# Patient Record
Sex: Female | Born: 1940 | Race: White | Hispanic: No | Smoking: Never smoker
Health system: Southern US, Community
[De-identification: ages and names within clinical notes are randomized; demographics above are authoritative.]

## PROBLEM LIST (undated history)

## (undated) DIAGNOSIS — F319 Bipolar disorder, unspecified: Secondary | ICD-10-CM

## (undated) HISTORY — PX: ABDOMINAL HYSTERECTOMY: SHX81

## (undated) HISTORY — PX: APPENDECTOMY: SHX54

---

## 2002-09-17 ENCOUNTER — Encounter: Admission: RE | Admit: 2002-09-17 | Discharge: 2002-09-17 | Payer: Self-pay | Admitting: Internal Medicine

## 2004-02-07 ENCOUNTER — Ambulatory Visit: Payer: Self-pay | Admitting: Internal Medicine

## 2004-02-07 DIAGNOSIS — Q762 Congenital spondylolisthesis: Secondary | ICD-10-CM | POA: Insufficient documentation

## 2004-02-14 ENCOUNTER — Ambulatory Visit: Payer: Self-pay | Admitting: Internal Medicine

## 2004-03-20 ENCOUNTER — Ambulatory Visit: Payer: Self-pay | Admitting: Internal Medicine

## 2004-04-19 ENCOUNTER — Ambulatory Visit: Payer: Self-pay

## 2004-04-20 ENCOUNTER — Ambulatory Visit (HOSPITAL_COMMUNITY): Admission: RE | Admit: 2004-04-20 | Discharge: 2004-04-20 | Payer: Self-pay | Admitting: Internal Medicine

## 2004-04-27 ENCOUNTER — Ambulatory Visit: Payer: Self-pay

## 2004-06-29 ENCOUNTER — Encounter: Admission: RE | Admit: 2004-06-29 | Discharge: 2004-06-29 | Payer: Self-pay | Admitting: Internal Medicine

## 2005-03-14 ENCOUNTER — Ambulatory Visit: Payer: Self-pay | Admitting: Internal Medicine

## 2005-03-21 ENCOUNTER — Ambulatory Visit: Payer: Self-pay | Admitting: Internal Medicine

## 2005-04-17 ENCOUNTER — Ambulatory Visit: Payer: Self-pay | Admitting: Internal Medicine

## 2005-05-02 ENCOUNTER — Encounter: Admission: RE | Admit: 2005-05-02 | Discharge: 2005-05-02 | Payer: Self-pay | Admitting: Internal Medicine

## 2005-06-06 ENCOUNTER — Ambulatory Visit: Payer: Self-pay | Admitting: Internal Medicine

## 2005-07-14 ENCOUNTER — Emergency Department (HOSPITAL_COMMUNITY): Admission: EM | Admit: 2005-07-14 | Discharge: 2005-07-14 | Payer: Self-pay | Admitting: Emergency Medicine

## 2005-07-19 ENCOUNTER — Ambulatory Visit: Payer: Self-pay | Admitting: Infectious Diseases

## 2005-07-19 ENCOUNTER — Ambulatory Visit: Payer: Self-pay | Admitting: Internal Medicine

## 2005-07-19 ENCOUNTER — Inpatient Hospital Stay (HOSPITAL_COMMUNITY): Admission: AD | Admit: 2005-07-19 | Discharge: 2005-07-22 | Payer: Self-pay | Admitting: Internal Medicine

## 2005-07-24 ENCOUNTER — Ambulatory Visit: Payer: Self-pay | Admitting: Internal Medicine

## 2005-09-23 ENCOUNTER — Ambulatory Visit: Payer: Self-pay | Admitting: Internal Medicine

## 2005-11-20 ENCOUNTER — Ambulatory Visit: Payer: Self-pay | Admitting: Internal Medicine

## 2005-12-11 ENCOUNTER — Ambulatory Visit (HOSPITAL_COMMUNITY): Admission: RE | Admit: 2005-12-11 | Discharge: 2005-12-11 | Payer: Self-pay | Admitting: Gastroenterology

## 2005-12-11 ENCOUNTER — Encounter (INDEPENDENT_AMBULATORY_CARE_PROVIDER_SITE_OTHER): Payer: Self-pay | Admitting: Specialist

## 2006-07-08 ENCOUNTER — Inpatient Hospital Stay (HOSPITAL_COMMUNITY): Admission: AD | Admit: 2006-07-08 | Discharge: 2006-07-18 | Payer: Self-pay | Admitting: *Deleted

## 2006-07-08 ENCOUNTER — Encounter: Payer: Self-pay | Admitting: Emergency Medicine

## 2006-07-08 ENCOUNTER — Ambulatory Visit: Payer: Self-pay | Admitting: *Deleted

## 2006-08-12 ENCOUNTER — Ambulatory Visit: Payer: Self-pay | Admitting: Internal Medicine

## 2006-08-12 ENCOUNTER — Encounter (INDEPENDENT_AMBULATORY_CARE_PROVIDER_SITE_OTHER): Payer: Self-pay | Admitting: Internal Medicine

## 2006-08-12 DIAGNOSIS — M949 Disorder of cartilage, unspecified: Secondary | ICD-10-CM

## 2006-08-12 DIAGNOSIS — R252 Cramp and spasm: Secondary | ICD-10-CM

## 2006-08-12 DIAGNOSIS — F411 Generalized anxiety disorder: Secondary | ICD-10-CM | POA: Insufficient documentation

## 2006-08-12 DIAGNOSIS — F329 Major depressive disorder, single episode, unspecified: Secondary | ICD-10-CM

## 2006-08-12 DIAGNOSIS — E785 Hyperlipidemia, unspecified: Secondary | ICD-10-CM | POA: Insufficient documentation

## 2006-08-12 DIAGNOSIS — K219 Gastro-esophageal reflux disease without esophagitis: Secondary | ICD-10-CM

## 2006-08-12 DIAGNOSIS — M899 Disorder of bone, unspecified: Secondary | ICD-10-CM | POA: Insufficient documentation

## 2006-08-12 LAB — CONVERTED CEMR LAB
ALT: 12 units/L (ref 0–35)
AST: 22 units/L (ref 0–37)
Albumin: 3.5 g/dL (ref 3.5–5.2)
Calcium: 9.1 mg/dL (ref 8.4–10.5)
Chloride: 100 meq/L (ref 96–112)
LDL Cholesterol: 173 mg/dL — ABNORMAL HIGH (ref 0–99)
Total CHOL/HDL Ratio: 3.5
Triglycerides: 116 mg/dL (ref <150)
VLDL: 23 mg/dL (ref 0–40)

## 2006-08-14 ENCOUNTER — Encounter (INDEPENDENT_AMBULATORY_CARE_PROVIDER_SITE_OTHER): Payer: Self-pay | Admitting: Internal Medicine

## 2006-09-24 ENCOUNTER — Emergency Department (HOSPITAL_COMMUNITY): Admission: EM | Admit: 2006-09-24 | Discharge: 2006-09-24 | Payer: Self-pay | Admitting: Emergency Medicine

## 2006-09-26 ENCOUNTER — Inpatient Hospital Stay (HOSPITAL_COMMUNITY): Admission: EM | Admit: 2006-09-26 | Discharge: 2006-10-06 | Payer: Self-pay | Admitting: Emergency Medicine

## 2006-09-29 ENCOUNTER — Ambulatory Visit: Payer: Self-pay | Admitting: Vascular Surgery

## 2006-09-29 ENCOUNTER — Encounter (INDEPENDENT_AMBULATORY_CARE_PROVIDER_SITE_OTHER): Payer: Self-pay | Admitting: *Deleted

## 2006-09-30 ENCOUNTER — Ambulatory Visit: Payer: Self-pay | Admitting: Internal Medicine

## 2006-10-13 ENCOUNTER — Telehealth (INDEPENDENT_AMBULATORY_CARE_PROVIDER_SITE_OTHER): Payer: Self-pay | Admitting: Internal Medicine

## 2007-02-05 ENCOUNTER — Ambulatory Visit: Payer: Self-pay | Admitting: *Deleted

## 2007-02-05 DIAGNOSIS — G4700 Insomnia, unspecified: Secondary | ICD-10-CM

## 2007-03-10 IMAGING — CR DG CHEST 2V
2 series · 2 of 2 positions shown · non-contrast
Comparison: No prior exams for comparison.

CLINICAL DATA: 64-year-old with facial abscess.  Swelling and redness into arms.
 CHEST ? 2 VIEW:

[view not recorded (1 of 2)]
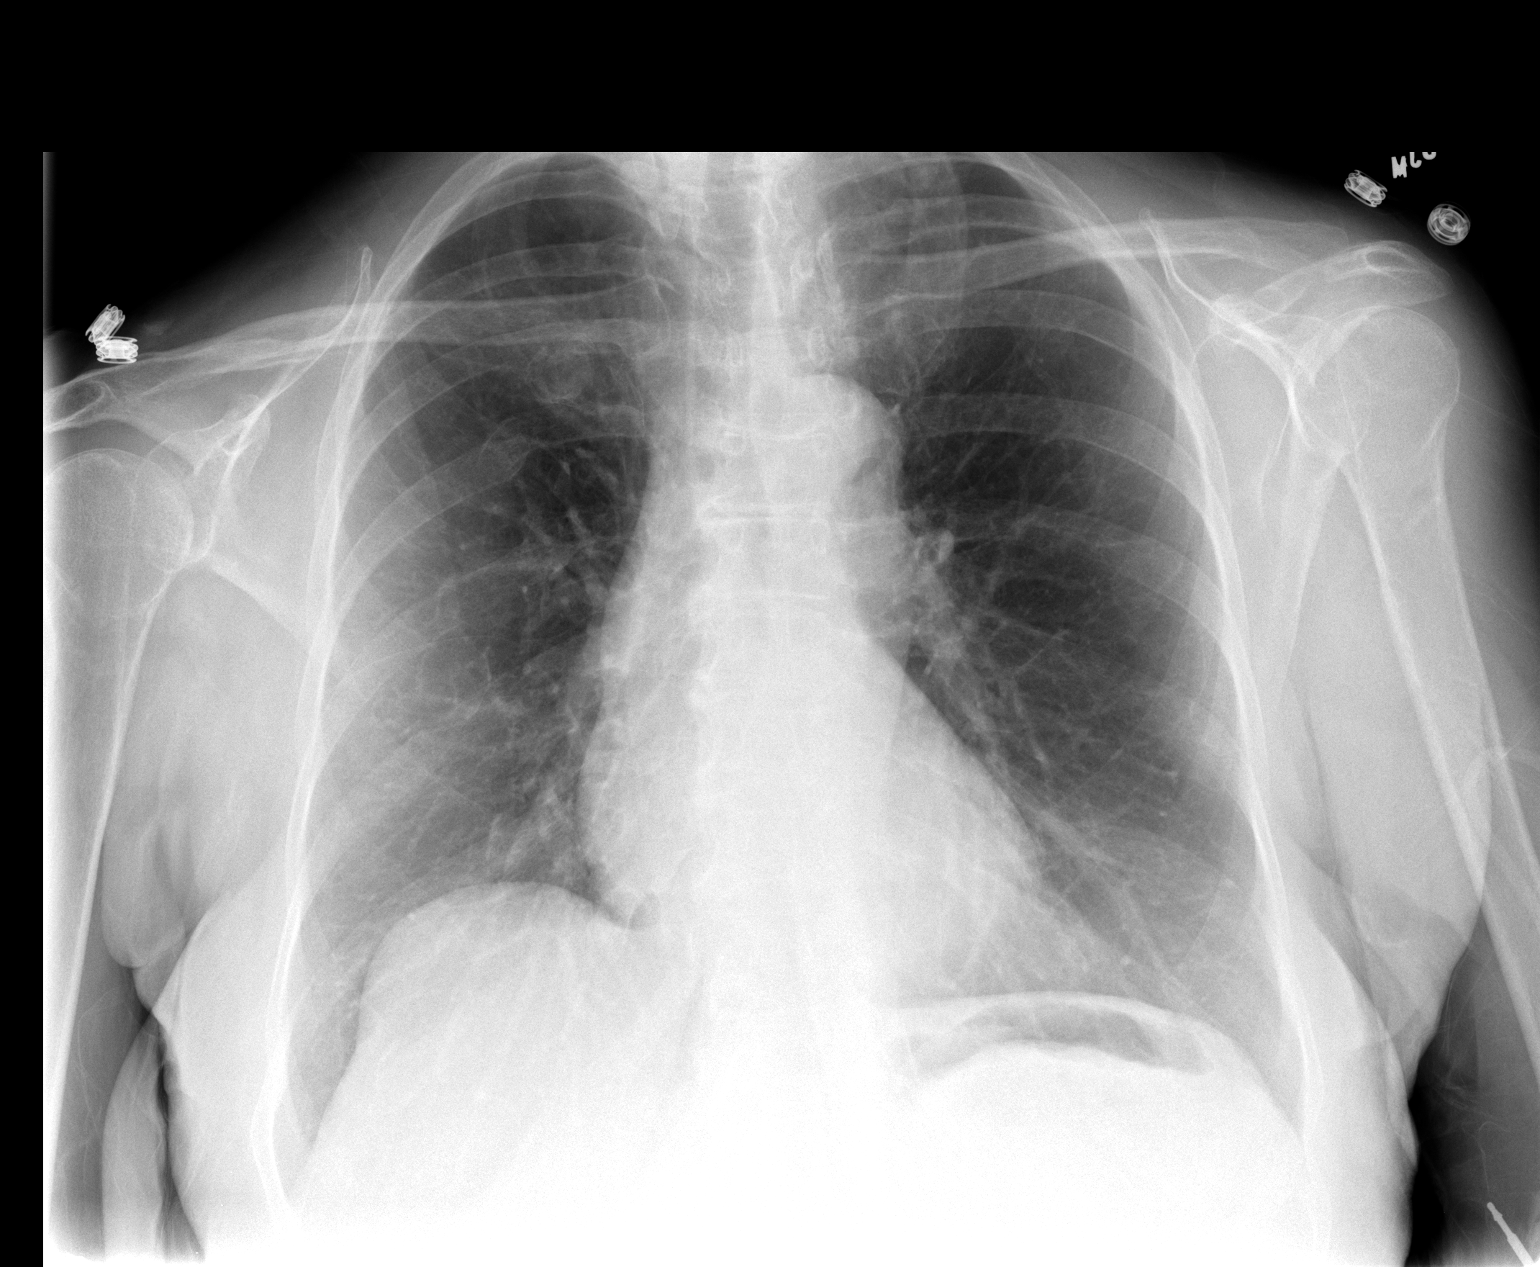

[view not recorded (2 of 2)]
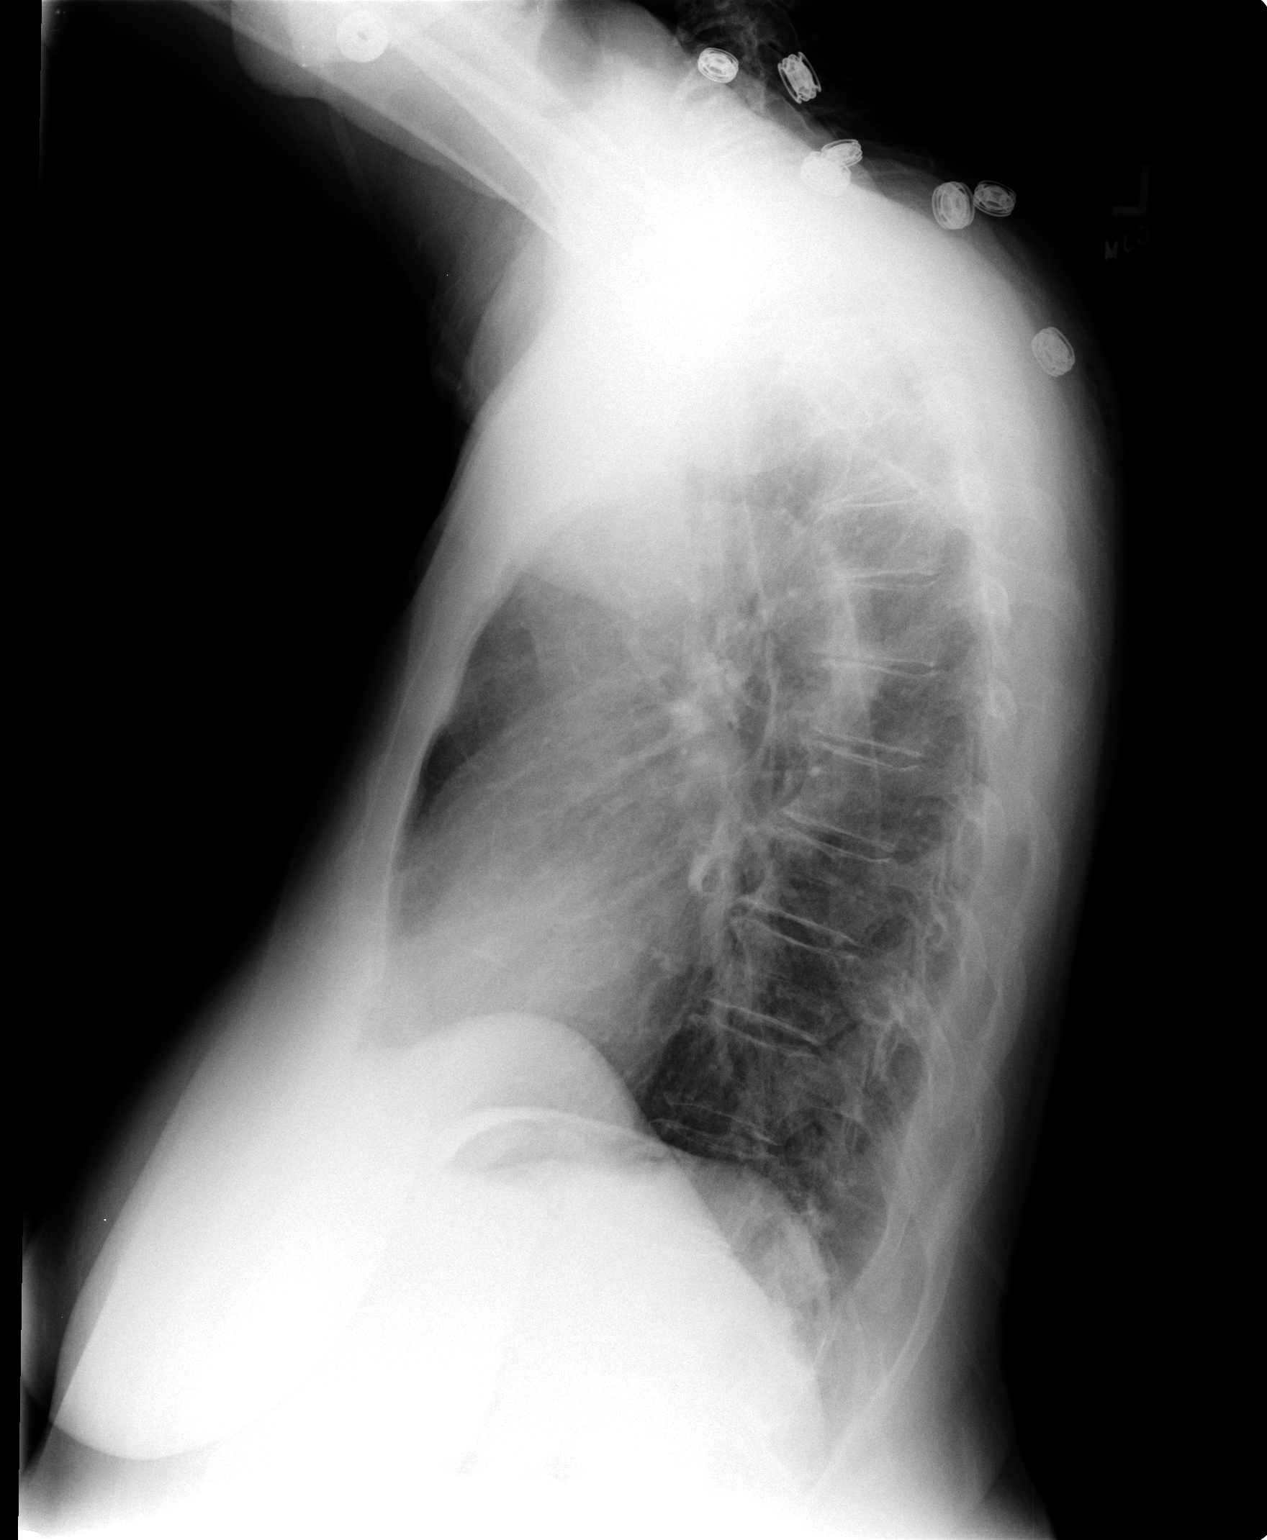

[2 of 2 positions shown; findings below may reference images not displayed]

FINDINGS: There is elevation of the right hemidiaphragm.  Heart size is normal.  No focal consolidations or pleural effusions.  Note is made of old right rib fractures.  No acute fractures or dislocation identified.  No evidence for bone erosion involving the upper ribs.
IMPRESSION: No evidence for acute cardiopulmonary disease.

## 2007-03-11 IMAGING — DX DG ORTHOPANTOGRAM /PANORAMIC
1 series · 1 of 1 positions shown · non-contrast
Comparison: none

CLINICAL DATA: 64-year-old with facial abscess. 
PANOREX VIEW OF THE MANDIBLE:

[view not recorded]
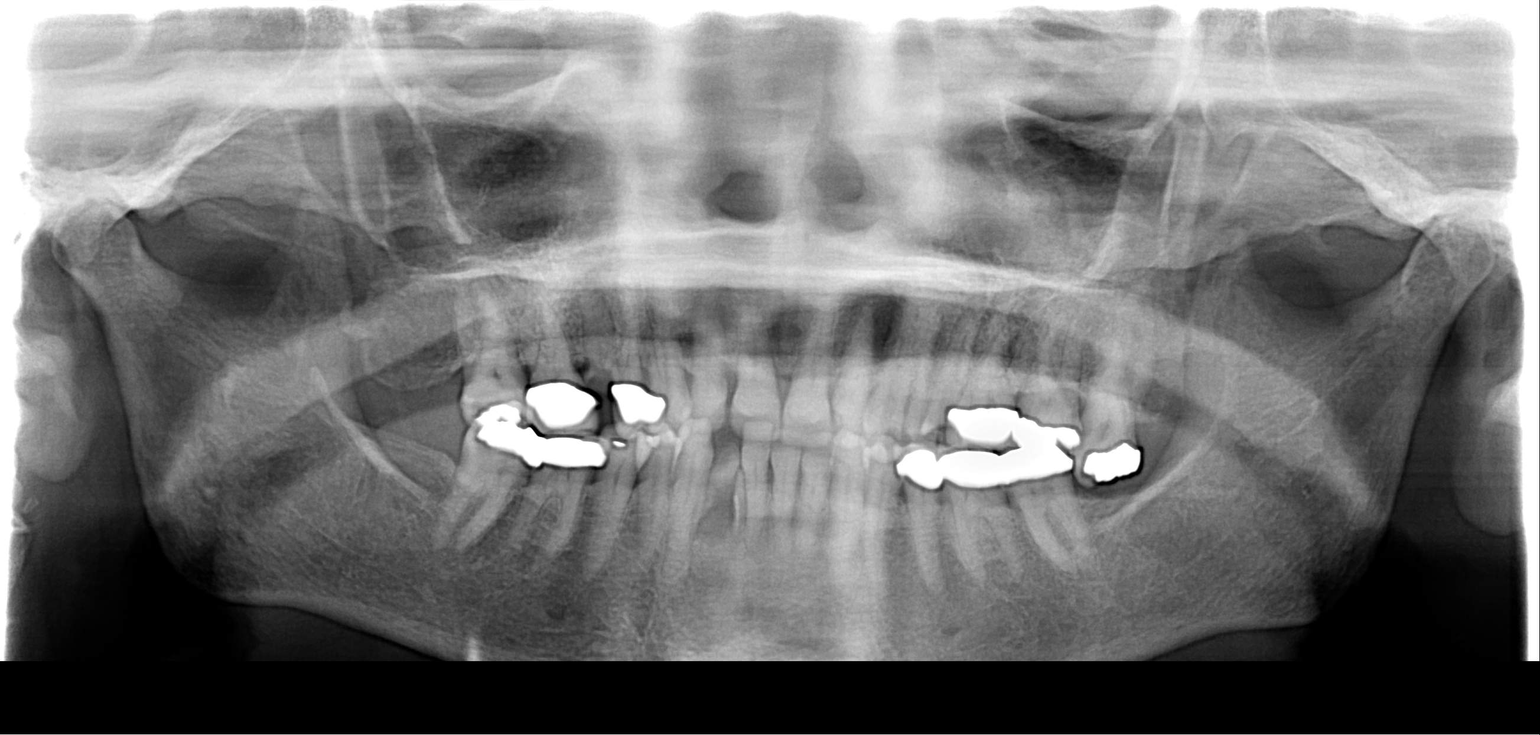

[1 of 1 positions shown; findings below may reference images not displayed]

FINDINGS: No evidence for periapical abscesses involving the mandibular teeth.  Lucency of the right lateral incisor may be dental caries.  There is some lucency around the left maxillary lateral incisor but this does not correspond to the CT findings and may be artifact.
IMPRESSION: Dental caries involving the right lateral mandibular incisor.  There is also apparent lucency around the left maxillary  lateral incisor but this may be artifact and does correlate with the CT findings.  I do not see any definite other significant findings involving the left maxillary teeth other than dental caries.

## 2007-03-31 ENCOUNTER — Ambulatory Visit: Payer: Self-pay | Admitting: Hospitalist

## 2007-03-31 ENCOUNTER — Encounter (INDEPENDENT_AMBULATORY_CARE_PROVIDER_SITE_OTHER): Payer: Self-pay | Admitting: *Deleted

## 2007-04-11 LAB — CONVERTED CEMR LAB
BUN: 20 mg/dL (ref 6–23)
CO2: 25 meq/L (ref 19–32)
Calcium: 9.4 mg/dL (ref 8.4–10.5)
Chloride: 104 meq/L (ref 96–112)
Creatinine, Ser: 1.05 mg/dL (ref 0.40–1.20)
Glucose, Bld: 112 mg/dL — ABNORMAL HIGH (ref 70–99)
Triglycerides: 107 mg/dL (ref <150)

## 2007-04-30 ENCOUNTER — Ambulatory Visit: Payer: Self-pay | Admitting: Internal Medicine

## 2007-04-30 ENCOUNTER — Encounter (INDEPENDENT_AMBULATORY_CARE_PROVIDER_SITE_OTHER): Payer: Self-pay | Admitting: Internal Medicine

## 2007-04-30 LAB — CONVERTED CEMR LAB
CO2: 27 meq/L (ref 19–32)
Calcium: 9.1 mg/dL (ref 8.4–10.5)
Creatinine, Ser: 1.05 mg/dL (ref 0.40–1.20)
Glucose, Bld: 85 mg/dL (ref 70–99)

## 2008-05-17 IMAGING — CR DG THORACIC SPINE 2V
3 series · 3 of 3 positions shown · non-contrast
Comparison: none

CLINICAL DATA: Patient fell, has mid to upper back pain.
 THORACIC SPINE ? 3 VIEW:

[t t-spine a.p.]
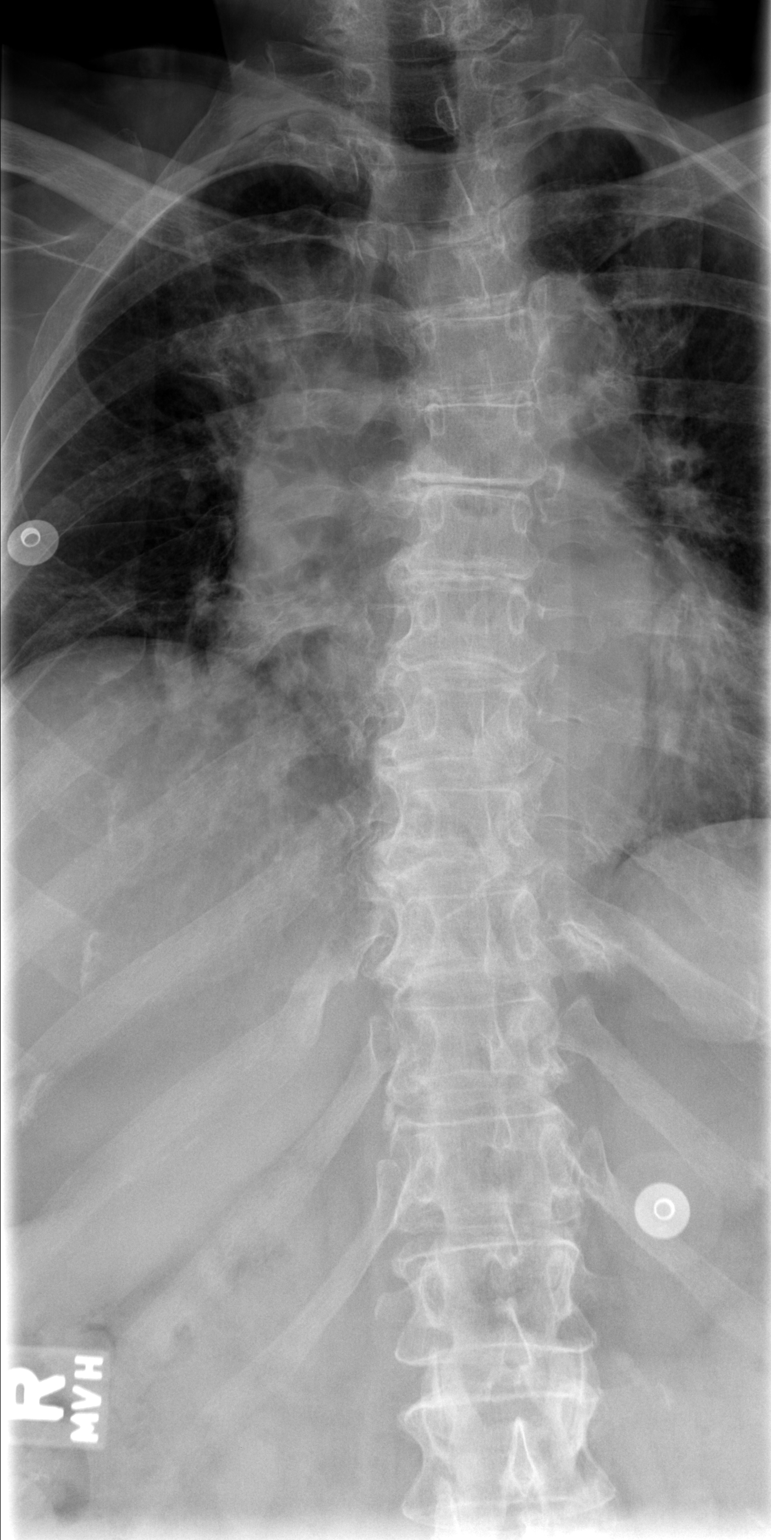

[t t-spine lat]
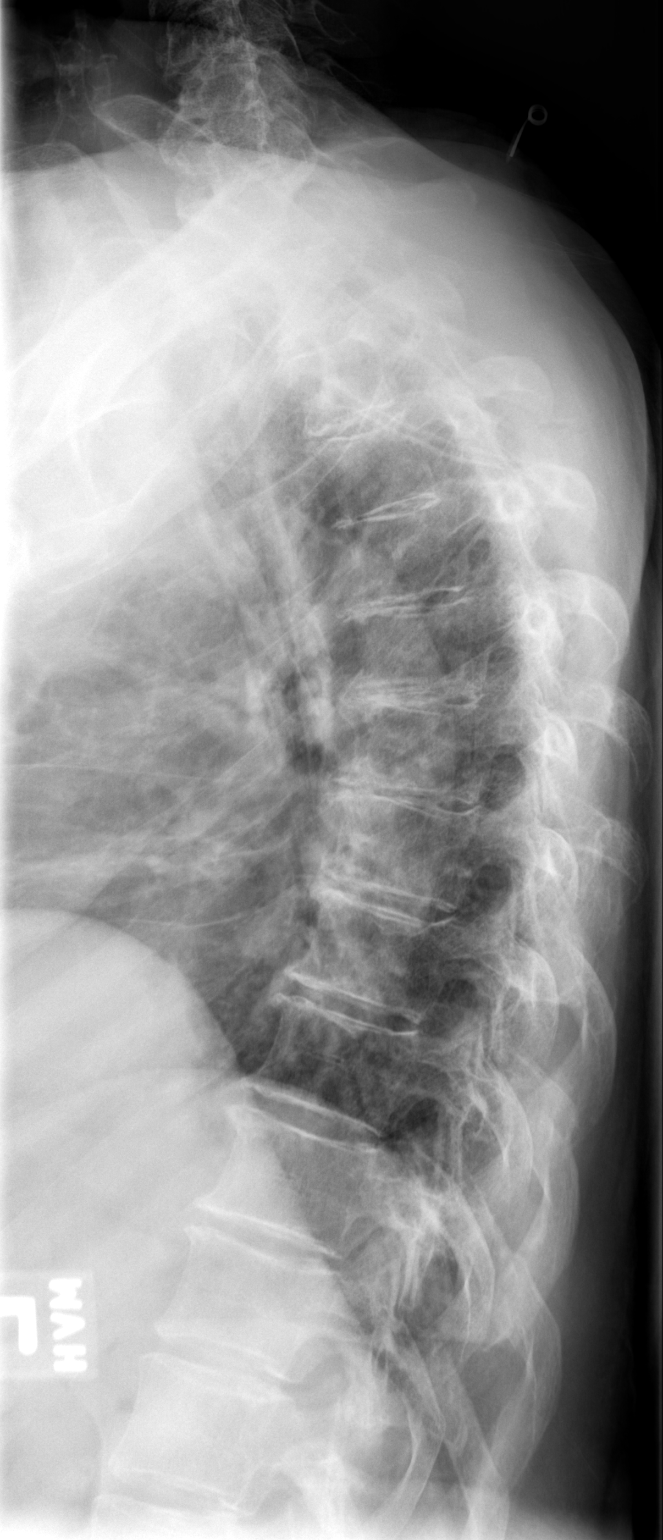

[t swimmers *]
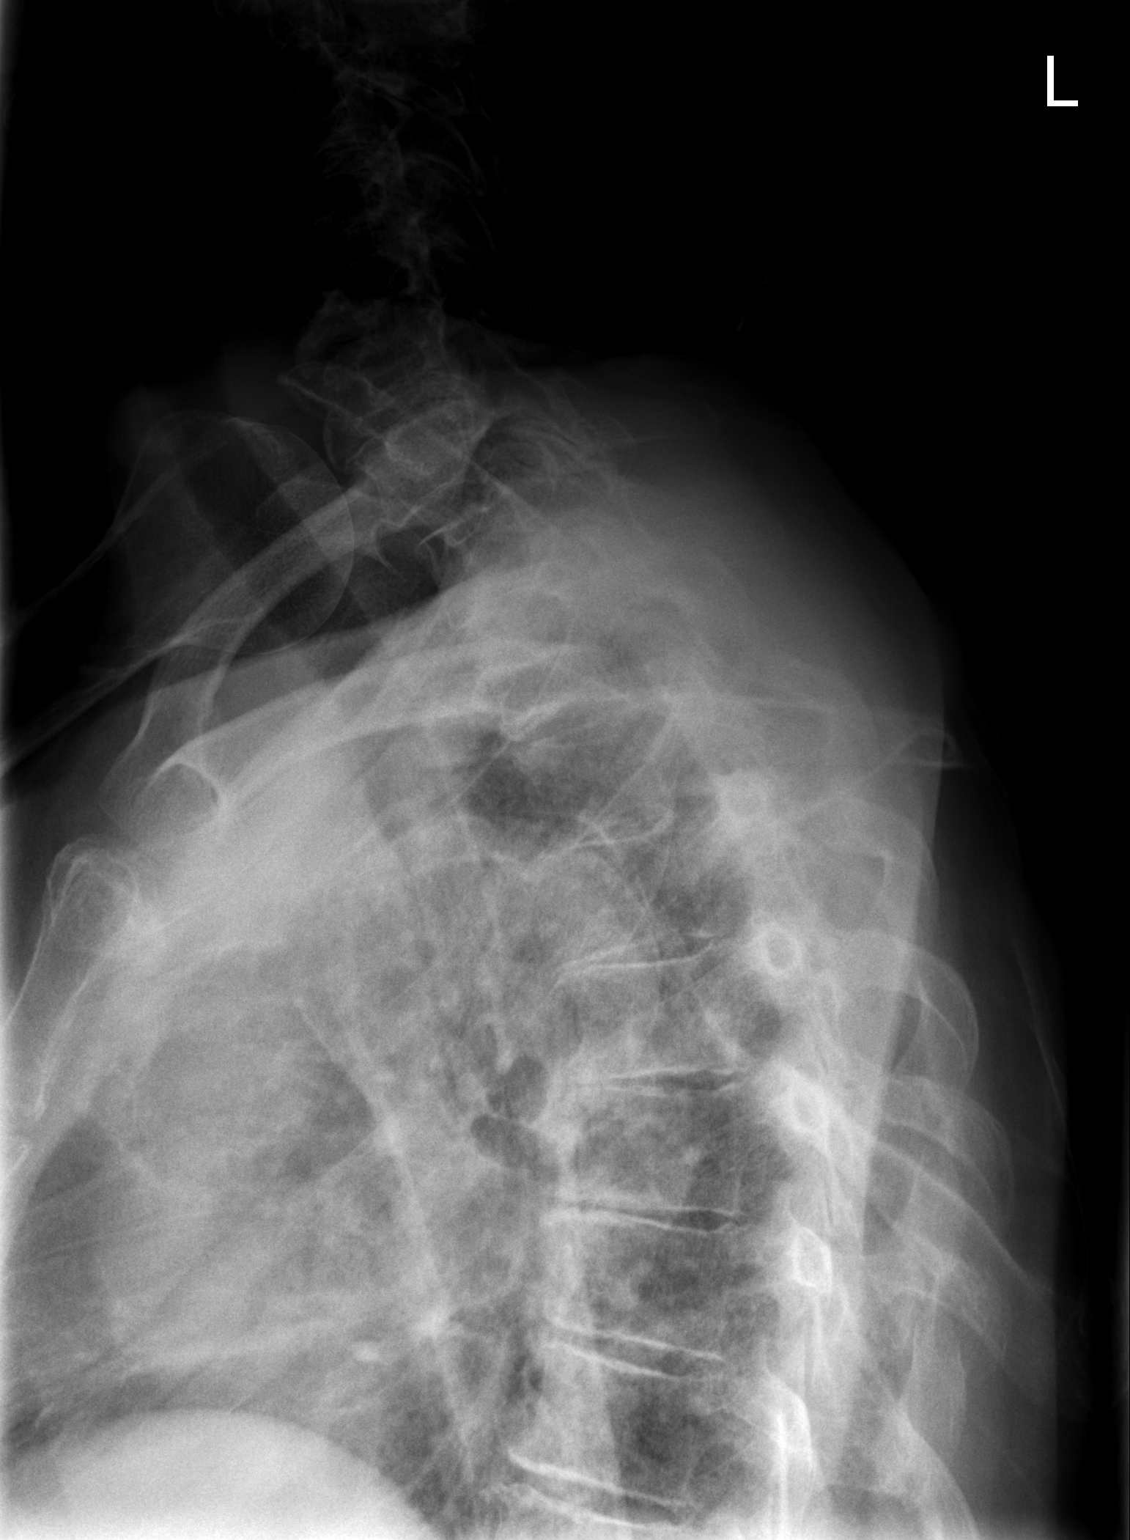

[3 of 3 positions shown; findings below may reference images not displayed]

FINDINGS: Mild mid to lower thoracic scoliosis convex to the right and mild upper thoracic scoliosis convex to the left.  No acute bony abnormality.  Degenerative disk disease changes are present at T5-6, T6-7, and T7-8.  Remote healed fracture posterior medial aspect of right 10th rib.
IMPRESSION: Mild scoliosis and degenerative spondylotic changes.  No acute bony abnormality.

## 2010-04-18 ENCOUNTER — Encounter
Admission: RE | Admit: 2010-04-18 | Discharge: 2010-04-18 | Payer: Self-pay | Source: Home / Self Care | Attending: Geriatric Medicine | Admitting: Geriatric Medicine

## 2010-04-29 ENCOUNTER — Encounter: Payer: Self-pay | Admitting: Internal Medicine

## 2010-08-21 NOTE — Consult Note (Signed)
Darlene Phillips, Darlene Phillips              ACCOUNT NO.:  [id number]   MEDICAL RECORD NO.:  [id number]          PATIENT TYPE:  INP   LOCATION:  1417                         FACILITY:  [facility]   PHYSICIAN:  Antonietta Breach, M.D.  DATE OF BIRTH:  [birth year]   DATE OF CONSULTATION:  10/06/2006  DATE OF DISCHARGE:                                 CONSULTATION   REASON FOR STAT STATUS:  The patient is pending discharge.   Darlene Phillips has experienced improved mood.  Her energy is improved. Her  sleep is still only 5-1/2 hours per night.  This leaves her with fatigue  in the morning. She has intact interests. Her concentration is still  slightly decreased.   REVIEW OF SYSTEMS:  No hallucinations, no delusions.  She is cooperative  with care and socially appropriate.   PSYCHOTROPIC MEDICATIONS:  Continue to be:  1. Celexa 20 mg daily.  2. Risperdal 2 mg nightly.  3. Tranxene 7.5 mg nightly p.r.n.  4. Ambien 10 mg nightly p.r.n.   PHYSICAL EXAMINATION:  VITAL SIGNS:  Temperature 98.3, pulse 90,  respirations 20, blood pressure 116/60, O2 saturation on room air 97%.   The patient did get 10 mg of Ambien last night but still did not have  adequate sleep.   MENTAL STATUS EXAM:  Darlene Phillips is alert.  Her eye contact is good.  She is oriented to all spheres.  Her memory is intact to immediate,  recent, and remote.  Thought process is logical, coherent, goal  directed. No looseness of association.  Thought content:  No thoughts of  harming herself, no thoughts of harming others.  No delusions, no  hallucinations.  Speech is within normal limits.  Concentration is  slightly decreased.  Insight is good.  Judgment is intact. She is well  groomed.   ASSESSMENT:  1. 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  2. 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   The undersigned provided educations.  The indications, alternatives, and  adverse effects  of Trazodone were discussed with the patient for anti-  insomnia with benefits of not being addictive and being able to augment  Celexa.  The patient understands above information and would like to  start Trazodone.   RECOMMENDATIONS:  1. Would start Trazodone at 25 mg nightly, keeping the Ambien 10 mg      nightly p.r.n.  Would then increase the Trazodone by 25-50 mg per      day depending upon the patient's sleep pattern the night before.      The estimated effective dose of Trazodone will be between 50-200 mg      nightly.  2. Would continue Risperdal 2 mg nightly for anti-psychotic      prevention.  She can be tried off over the next few months by her      outpatient psychiatrist under close supervision; however, she is      too close to her psychotic episode to try the Risperdal off yet.  3. Would discontinue the Tranxene p.r.n.  4. Would continue the Celexa  20 mg q.a.m. for anti-depression.  5. Would ask case manager to set this patient up with her outpatient      psychiatrist within a week of discharge.      Antonietta Breach, M.D.  Electronically Signed     JW/MEDQ  D:  10/06/2006  T:  10/06/2006  Job:  [id number]
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
## 2010-08-24 NOTE — Consult Note (Signed)
[name], [name]              ACCOUNT NO.:  [id number]   MEDICAL RECORD NO.:  [id number]          PATIENT TYPE:  INP   LOCATION:  5715                         FACILITY:  [facility]   PHYSICIAN:  Kristine Garbe. Ezzard Standing, M.D.DATE OF BIRTH:  1940/10/02   DATE OF CONSULTATION:  07/19/2005  DATE OF DISCHARGE:                                   CONSULTATION   REASON FOR CONSULT:  Evaluate patient with left facial cellulitis abscess.   BRIEF HISTORY:  Darlene Phillips is a 70 year old female who has had left  facial swelling and pain for over 5 days.  She has been on outpatient Keflex  and pain medication.  The swelling has increased and she presented to the  clinic because of increased swelling not responding to the Keflex.  She  actually, in the meantime, developed a spontaneous rupture of a cheek  abscess intra-orally.  Clinical findings and drainage are consistent with  probable periapical abscess.  The patient was admitted for IV antibiotics  and a CT scan.  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  On  review of the CT scan, this shows findings consistent with probable  periapical abscess of tooth #13 with a still small remaining abscess and  surrounding cellulitis.  Sinuses are essentially uninvolved with clear and  with some mild mucoperiosteal thickening, but no air fluid levels and no  opacification of the sinuses.  There is no periorbital swelling or evidence  of infection around the eye.  Findings are consistent with a periodontal  abscess and secondary cellulitis.   IMPRESSION:  Periodontal abscess of tooth #13 with surrounding cellulitis.   RECOMMENDATIONS:  Recommend culture of drainage from the abscess.  Appropriate p.o. antibiotics for periodontal disease, probably penicillin or  Cleocin and would be better empirically than Keflex.  Would recommend follow  up with a dentist or oral surgeon.   Of note, the patient had seen an oral  surgeon about 6 months ago about some dental disease, but had nothing done.  Might can consult with a dentist or oral surgery concerning appropriate  antibiotics, and follow up and treatment of periodontal disease.           ______________________________  Kristine Garbe. Ezzard Standing, M.D.     CEN/MEDQ  D:  07/19/2005  T:  07/20/2005  Job:  [id number]
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
## 2010-08-24 NOTE — Op Note (Signed)
NAMEALORIA, Darlene Phillips              ACCOUNT NO.:  [id number]   MEDICAL RECORD NO.:  [id number]          PATIENT TYPE:  AMB   LOCATION:  ENDO                         FACILITY:  [facility]   PHYSICIAN:  Anselmo Rod, M.D.  DATE OF BIRTH:  November 19, 1940   DATE OF PROCEDURE:  12/11/2005  DATE OF DISCHARGE:                                 OPERATIVE REPORT   PROCEDURE:  Colonoscopy with cold biopsies x3.   ENDOSCOPIST:  Anselmo Rod, M.D.   INSTRUMENT USED:  Olympus video colonoscope.   INDICATIONS FOR PROCEDURE:  A 70 year old white female underwent a screening  colonoscopy to rule out colonic polyps, masses, etc. The patient has a  history of occasional rectal bleeding which she attributes to her  hemorrhoids.   PREPROCEDURE PREPARATION:  Informed consent was procured from the patient.  The patient fasted for 4 hours prior to the procedure and prepped with 32  Osmoprep pills the night of and the morning of the procedure. The risks and  benefits of the procedure including a 10% missed rate of cancer and polyp  were discussed with the patient as well.   PREPROCEDURE PHYSICAL:  The patient had stable vital signs. Neck supple.  Chest clear to auscultation. S1, S2 regular. Abdomen soft with normal bowel  sounds.   DESCRIPTION OF PROCEDURE:  The patient was placed in the left lateral  decubitus position and sedated with 100 mcg of fentanyl and 10 mg of Versed  in slow incremental doses. Once the patient was adequately sedated and  maintained on low flow oxygen and continuous cardiac monitoring, the Olympus  video colonoscope was advanced from the rectum to the cecum. The appendiceal  orifice and ileocecal valve were clearly visualized and photographed. The  terminal ileum appeared normal without lesions. Three small sessile polyps  were biopsied from the rectosigmoid colon. Small internal hemorrhoids were  seen on retroflexion. Multiple washings were done as there was some residual  stool  in the colon. The patient tolerated the procedure well and without  complications.   IMPRESSION:  1.Small nonbleeding internal hemorrhoids.  2.Three small sessile polyps biopsied from the rectosigmoid colon.  3.Otherwise normal exam to the terminal ileum.   RECOMMENDATIONS:  1.Await pathology results.  2.Repeat colonoscopy depending on pathology results.  3.Avoid all nonsteroidals including aspirin for the next 2 weeks.  4.Outpatient followup as the need arises in the future.      Anselmo Rod, M.D.  Electronically Signed     JNM/MEDQ  D:  12/11/2005  T:  12/11/2005  Job:  [id number]   cc:   Clearance Coots, M.D.

## 2010-08-24 NOTE — Discharge Summary (Signed)
Darlene Phillips, Darlene Phillips              ACCOUNT NO.:  [id number]   MEDICAL RECORD NO.:  [id number]          PATIENT TYPE:  INP   LOCATION:  5715                         FACILITY:  [facility]   PHYSICIAN:  Darlene Phillips, M.D.    DATE OF BIRTH:  1941/01/24   DATE OF ADMISSION:  07/19/2005  DATE OF DISCHARGE:  07/22/2005                                 DISCHARGE SUMMARY   PRIMARY CARE PHYSICIAN:  Darlene Phillips, M.D.   CONSULTATIONS:  1.  Darlene Phillips, M.D., infectious disease.  2.  Darlene Phillips, M.D., ENT.  3.  Darlene Phillips, D.D.S., oral surgery.   ADMISSION DIAGNOSES:  1.  A left maxillary abscess and left periapical abscess.  2.  Macular rash that covered her extremities likely secondary to Keflex      antibiotic.   DISCHARGE MEDICATIONS:  1.  Augmentin 875 mg one tablet b.i.d. x7 days  2.  Lipitor 20 mg daily.  3.  Tranxene 22.5 mg nightly.  4.  Aspirin 81 mg daily.  5.  Benadryl 25 mg b.i.d.  6.  Amitriptyline 25 mg each night.   FOLLOW UP:  She will be seen by Darlene Phillips in oral surgery for followup  for likely a surgery intervention.  She will also be following up with Dr.  Alvester Phillips in the outpatient clinic here at [facility] to follow her routine  medical problems.  She also has migraine headaches in the hospital.  The  patient responded to Imitrex during her admission.  Recommend followup of  her migraines with her primary care physician as well.   X-RAY STUDIES AND TESTS:  During her admission, she received a CT scan of  her face.  The results show that there is significant metal from prior  dental work which created artifact.  There is a small fluid collection  containing a bubble of gas just anterior to the left maxillary arch that his  suspicious for a small abscess.  This appears to be adjacent to a tooth with  a small periapical abscess.  In addition, there is a small amount of gas  lateral to the alveolar ridge at tooth #16.  There are caries of tooth  #27  and possible periapical abscess involving the tooth's well, but no evidence  for fracture.  There is significant soft tissue swelling anterior to the  left maxilla and there is bilateral, chronic maxillary sinus change seen.  No air fluid levels were seen to suggest acute sinusitis.  The overall  impression is suspected small abscess anterior to the left maxilla, probably  odontogenic in origin.  Also, suspect periapical abscess in tooth #27.  An  orthopantogram was followed and the orthopantogram results showed that there  are dental caries that involve the right lateral mandibular incisors and  there is also an apparent lucency around the left maxillary lateral incisor,  however, this might be artifact and does correlate with CT findings.  There  were no other significant findings involving the left maxillary teeth on the  orthopantogram.  During her admission, she received three consultations by  Dr. Rockey Situ.  Darlene Phillips in infectious disease, Dr. Ezzard Phillips in ENT, as well as  Darlene Phillips from oral surgery.   HISTORY OF PRESENT ILLNESS:  Darlene Phillips is a 70 year old female that came  to the clinic in the outpatient clinic at [facility] for followup  for facial swelling, but was addressed on July 14, 2005, at the [facility]  ED.  She had been diagnosed with cellulitis and discharged on Keflex 500 mg  b.i.d. and Vicodin for pain.  She came to the clinic here and was admitted  from the clinic due to residual swelling and pain after the initiation of  Keflex as well as having a red rash that she had developed during her course  of Keflex.  She also had noted there had been drainage into her mouth from  the left side of her upper gum line and she stated that this drainage was  watery and tasted like tears and after the drainage entered the mouth which  occurred 2 days prior to her presentation at the [facility], she said that it tasted watery like salt water.   She had also  noticed that her tongue had become tender and raw feeling shortly before  coming to [facility] and she stated that the pain in her face had become  more localized.  She had noticed that her arms were very red and then she  noticed the rest of this red rash was now included in the rest of her body.  Of note, the patient could not remember the last time she had a complete  dental exam.   PHYSICAL EXAMINATION:  HEENT:  Normal vision.  Clear conjunctivae of her  eyes.  There is clear drainage from her right nare.  Her oropharynx was  nonerythematous, however, her left superior gum line was significantly  erythematous and swollen with a 2 cm area of white and overlying granulation  tissue along the superior left gum line.  Her tongue was red and tender to  palpation.  Furthermore, there is erythema over the left maxillary region of  her face.  It was also swollen and tender.  Bilateral arms and bilateral  proximal thighs, abdomen, anterior and posterior thighs, there was an  erythematous macular rash with no papules.  It was not itchy, but hot to  touch.  NEUROLOGIC:  Cranial nerves 2-12 grossly intact and she was alert  and oriented to person, place and time.   LABORATORY DATA AND X-RAY FINDINGS:  CBC which showed white count 5.8,  hemoglobin 11.0, hematocrit 31.6, platelet count 397.  She also had a BMET  showing a sodium of 144, potassium 4.2, chloride 105, bicarb 31, glucose  105, BUN 15, creatinine 1.0 and a calcium of 8.8.  She also received two  blood cultures as well as a UA, urine culture as well as a smear, culture  and Gram stain of the area of drainage in her gums.   HOSPITAL COURSE:  Darlene Phillips was significant for her admission.  She had  the significant pain and swelling over her left maxillary face region as  well as an area of drainage in her left superior gum line.  She had come in  on Keflex, but because of her red macular rash over her arms, legs and trunk,  we were concerned that it could have been due to the Keflex.  Due to  the residual swelling and pain as well as the drainage into her mouth, we  knee that we need to continue antibiotics even if we stop the Keflex.  Therefore, we received a consult from infectious disease by Dr. Roxan Phillips who  recommended started her on vancomycin and Flagyl.  We started the vancomycin  and Flagyl and we also received a CT of her face.  The CT scan showed a left  maxillary abscess as well as a left periapical abscess.  At this point in  time, we decided to continue the antibiotics, however, we needed to get ENT  consultation to see if it was possible that they could drain the abscess.  Dr. Ezzard Phillips from ENT came and he assessed the patient and recommended that  the patient see an oral surgeon as he saw this to be primarily a dental  problem.  Therefore, Darlene Phillips was contacted in oral surgery and Dr.  Warren Phillips has agreed to see the patient on an outpatient basis.  Therefore,  because she was responding to the antibiotics and because it appeared that  the problem could be managed by oral medication, she was switched to  Augmentin and the macular rash had subsided.  The reason why she was  switched from the vancomycin and Flagyl to Augmentin was because infectious  disease, recommended after noting the periapical abscess of tooth #13 found  on CT, that she should be switched to the Augmentin.  Also of note, the  patient had a migraine headache on Sunday that was treated well with  Imitrex, however, she developed another migraine the following day on April  16, the day of discharge, and was also given Imitrex then.  It was likely  that the patient developed these migraines because she did not continue her  Tranxene while in house.  She was put on Klonopin in the evening because  Tranxene is not on the hospital formulary.  After scheduling the patient to  see Darlene Phillips, her day of discharge April, and  noting that the macular  rash on her body had subsided.  We saw that it was fine for her to be  discharged.  She will be seeing Darlene Phillips the day of discharge, April  16, for possible surgical intervention of her problem.   DISCHARGE DIAGNOSES:  1.  Maxillary abscess and periapical abscess.  2.  Macular rash that was likely an allergic rash secondary to the Keflex.  3.  Migraine headache that his well-controlled with Imitrex to be followed      up and addressed by her primary care physician.  4.  Hyperlipidemia.  5.  Anxiety.  6.  Chronic pain.  7.  Chronic allergies addressed by her primary care physician.   SUMMARY:  The patient is to follow up with Darlene Phillips for possible  surgical intervention of her left maxillary abscess and left periapical  abscess.  She is to follow up with her primary care physician, Darlene Phillips, M.D. for routine medical followup.  She is to follow up with Dr.  Alvester Phillips her primary care physician regarding her migraine.  Of note, the patient responded very well to Imitrex while in house.  It is likely that  her migraines were worsened while in the hospital due to the switch from  Tranxene to Klonopin, due to Tranxene not being on hospital formulary.  Likely, the problem will improve when she restarts her Tranxene on  outpatient basis.   Addendum: Patient has been discharged on augmentin. However please note  patient was allergic to keflex and had a rash. She  may be allergic to PCN as  well and may need other meds like flagyl or clindamycin. This can be  assessed at the follow-up visit day after tomorrow.      Ellie Lunch, M.D.  Electronically Signed      Darlene Phillips, M.D.  Electronically Signed    BP/MEDQ  D:  07/22/2005  T:  07/22/2005  Job:  [id number]

## 2011-01-23 LAB — COMPREHENSIVE METABOLIC PANEL
ALT: 24
ALT: 25
AST: 57 — ABNORMAL HIGH
AST: 80 — ABNORMAL HIGH
Albumin: 2.9 — ABNORMAL LOW
Alkaline Phosphatase: 30 — ABNORMAL LOW
BUN: 9
CO2: 28
CO2: 30
Calcium: 8.7
Chloride: 103
Chloride: 106
Creatinine, Ser: 0.77
Creatinine, Ser: 0.85
GFR calc Af Amer: >60
GFR calc Af Amer: >60
GFR calc non Af Amer: >60
GFR calc non Af Amer: >60
Glucose, Bld: 106 — ABNORMAL HIGH
Potassium: 3.4 — ABNORMAL LOW
Sodium: 141
Sodium: 141
Total Bilirubin: 0.8
Total Bilirubin: 0.9
Total Protein: 5.1 — ABNORMAL LOW

## 2011-01-23 LAB — URINALYSIS, ROUTINE W REFLEX MICROSCOPIC
Bilirubin Urine: NEGATIVE
Glucose, UA: NEGATIVE
Protein, ur: 30 — AB
Protein, ur: NEGATIVE
Specific Gravity, Urine: 1.007
Urobilinogen, UA: 0.2
pH: 6

## 2011-01-23 LAB — BASIC METABOLIC PANEL
BUN: 10
BUN: 10
BUN: 8
CO2: 25
CO2: 29
CO2: 31
CO2: 31
CO2: 32
Calcium: 9.2
Calcium: 9.4
Calcium: 9.4
Calcium: 9.8
Chloride: 101
Chloride: 102
Chloride: 103
Chloride: 104
Chloride: 99
Creatinine, Ser: 0.69
Creatinine, Ser: 0.74
Creatinine, Ser: 0.75
Creatinine, Ser: 0.77
Creatinine, Ser: 0.78
GFR calc Af Amer: >60
GFR calc Af Amer: >60
GFR calc Af Amer: >60
GFR calc Af Amer: >60
GFR calc non Af Amer: >60
GFR calc non Af Amer: >60
GFR calc non Af Amer: >60
Glucose, Bld: 115 — ABNORMAL HIGH
Glucose, Bld: 115 — ABNORMAL HIGH
Glucose, Bld: 116 — ABNORMAL HIGH
Potassium: 3.6
Potassium: 3.6
Potassium: 3.7
Potassium: 3.8
Sodium: 139
Sodium: 141
Sodium: 142

## 2011-01-23 LAB — DIFFERENTIAL
Basophils Relative: 0
Basophils Relative: 1
Eosinophils Absolute: 0
Lymphocytes Relative: 28
Monocytes Absolute: 0.5
Monocytes Relative: 10
Neutro Abs: 2.7
Neutrophils Relative %: 74

## 2011-01-23 LAB — CK TOTAL AND CKMB (NOT AT ARMC)
CK, MB: 81.4 — ABNORMAL HIGH
Relative Index: 0.5
Relative Index: 2
Total CK: 1564 — ABNORMAL HIGH

## 2011-01-23 LAB — CBC
HCT: 33 — ABNORMAL LOW
HCT: 34 — ABNORMAL LOW
HCT: 38.1
Hemoglobin: 11.9 — ABNORMAL LOW
Hemoglobin: 12.7
MCHC: 34.3
MCHC: 34.5
MCHC: 34.9
MCV: 89.3
MCV: 89.5
MCV: 89.9
MCV: 90
Platelets: 293
Platelets: 323
Platelets: 333
RBC: 3.79 — ABNORMAL LOW
RBC: 4.05
RBC: 4.2
RDW: 13.1
WBC: 5.3
WBC: 5.8
WBC: 8.4

## 2011-01-23 LAB — EPSTEIN-BARR VIRUS VCA ANTIBODY PANEL
EBV EA IgG: 0.83
EBV VCA IgG: 1.72 — ABNORMAL HIGH

## 2011-01-23 LAB — CK: Total CK: 437 — ABNORMAL HIGH

## 2011-01-23 LAB — CMV ABS, IGG+IGM (CYTOMEGALOVIRUS)
CMV IgM: <0.9 Index (ref <0.90)
Cytomegalovirus Ab-IgG: 5.5 Index — ABNORMAL HIGH (ref <0.80)

## 2011-01-23 LAB — URINE MICROSCOPIC-ADD ON

## 2011-01-23 LAB — POCT CARDIAC MARKERS: Myoglobin, poc: >500

## 2011-05-01 ENCOUNTER — Other Ambulatory Visit: Payer: Self-pay | Admitting: Geriatric Medicine

## 2011-05-01 DIAGNOSIS — N63 Unspecified lump in unspecified breast: Secondary | ICD-10-CM

## 2011-05-08 ENCOUNTER — Other Ambulatory Visit: Payer: Self-pay

## 2011-05-10 ENCOUNTER — Ambulatory Visit
Admission: RE | Admit: 2011-05-10 | Discharge: 2011-05-10 | Disposition: A | Discharge status: Home / Self Care | Payer: Medicare Other | Source: Ambulatory Visit | Attending: Geriatric Medicine | Admitting: Geriatric Medicine

## 2011-05-10 DIAGNOSIS — N63 Unspecified lump in unspecified breast: Secondary | ICD-10-CM

## 2013-06-24 ENCOUNTER — Other Ambulatory Visit: Payer: Self-pay | Admitting: Family Medicine

## 2013-06-24 DIAGNOSIS — Z1231 Encounter for screening mammogram for malignant neoplasm of breast: Secondary | ICD-10-CM

## 2013-07-06 ENCOUNTER — Ambulatory Visit: Payer: Medicare Other

## 2013-07-09 ENCOUNTER — Ambulatory Visit
Admission: RE | Admit: 2013-07-09 | Discharge: 2013-07-09 | Disposition: A | Discharge status: Home / Self Care | Payer: Medicare Other | Source: Ambulatory Visit | Attending: Family Medicine | Admitting: Family Medicine

## 2013-07-09 DIAGNOSIS — Z1231 Encounter for screening mammogram for malignant neoplasm of breast: Secondary | ICD-10-CM

## 2015-02-08 ENCOUNTER — Other Ambulatory Visit: Payer: Self-pay | Admitting: Geriatric Medicine

## 2015-02-08 DIAGNOSIS — Z1231 Encounter for screening mammogram for malignant neoplasm of breast: Secondary | ICD-10-CM

## 2015-02-21 ENCOUNTER — Ambulatory Visit
Admission: RE | Admit: 2015-02-21 | Discharge: 2015-02-21 | Disposition: A | Discharge status: Home / Self Care | Payer: Medicare Other | Source: Ambulatory Visit | Attending: Geriatric Medicine | Admitting: Geriatric Medicine

## 2015-02-21 DIAGNOSIS — Z1231 Encounter for screening mammogram for malignant neoplasm of breast: Secondary | ICD-10-CM

## 2016-11-19 ENCOUNTER — Other Ambulatory Visit: Payer: Self-pay | Admitting: Geriatric Medicine

## 2016-11-19 DIAGNOSIS — Z1231 Encounter for screening mammogram for malignant neoplasm of breast: Secondary | ICD-10-CM

## 2016-12-16 ENCOUNTER — Ambulatory Visit: Payer: Medicare Other

## 2017-04-17 ENCOUNTER — Emergency Department (HOSPITAL_COMMUNITY)
Admission: EM | Admit: 2017-04-17 | Discharge: 2017-04-17 | Disposition: A | Discharge status: Home / Self Care | Payer: Medicare Other | Attending: Emergency Medicine | Admitting: Emergency Medicine

## 2017-04-17 ENCOUNTER — Encounter (HOSPITAL_COMMUNITY): Payer: Self-pay

## 2017-04-17 ENCOUNTER — Other Ambulatory Visit: Payer: Self-pay

## 2017-04-17 DIAGNOSIS — Z79899 Other long term (current) drug therapy: Secondary | ICD-10-CM | POA: Insufficient documentation

## 2017-04-17 DIAGNOSIS — F250 Schizoaffective disorder, bipolar type: Secondary | ICD-10-CM | POA: Diagnosis not present

## 2017-04-17 DIAGNOSIS — F4329 Adjustment disorder with other symptoms: Secondary | ICD-10-CM

## 2017-04-17 DIAGNOSIS — F22 Delusional disorders: Secondary | ICD-10-CM | POA: Diagnosis not present

## 2017-04-17 DIAGNOSIS — Z046 Encounter for general psychiatric examination, requested by authority: Secondary | ICD-10-CM | POA: Diagnosis present

## 2017-04-17 HISTORY — DX: Bipolar disorder, unspecified: F31.9

## 2017-04-17 LAB — CBC WITH DIFFERENTIAL/PLATELET
BASOS ABS: 0 10*3/uL (ref 0.0–0.1)
BASOS PCT: 0 %
EOS PCT: 2 %
Eosinophils Absolute: 0.1 10*3/uL (ref 0.0–0.7)
HCT: 38 % (ref 36.0–46.0)
HEMOGLOBIN: 12.9 g/dL (ref 12.0–15.0)
LYMPHS ABS: 1.5 10*3/uL (ref 0.7–4.0)
Lymphocytes Relative: 23 %
MCH: 31.1 pg (ref 26.0–34.0)
MCHC: 33.9 g/dL (ref 30.0–36.0)
MCV: 91.6 fL (ref 78.0–100.0)
Monocytes Absolute: 0.7 10*3/uL (ref 0.1–1.0)
Monocytes Relative: 11 %
NEUTROS PCT: 64 %
Neutro Abs: 4 10*3/uL (ref 1.7–7.7)
PLATELETS: 393 10*3/uL (ref 150–400)
RBC: 4.15 MIL/uL (ref 3.87–5.11)
RDW: 13.3 % (ref 11.5–15.5)
WBC: 6.3 10*3/uL (ref 4.0–10.5)

## 2017-04-17 LAB — COMPREHENSIVE METABOLIC PANEL
ALBUMIN: 4 g/dL (ref 3.5–5.0)
ALT: 15 U/L (ref 14–54)
AST: 22 U/L (ref 15–41)
Alkaline Phosphatase: 47 U/L (ref 38–126)
Anion gap: 8 (ref 5–15)
BUN: 15 mg/dL (ref 6–20)
CHLORIDE: 103 mmol/L (ref 101–111)
CO2: 26 mmol/L (ref 22–32)
CREATININE: 0.78 mg/dL (ref 0.44–1.00)
Calcium: 9 mg/dL (ref 8.9–10.3)
GFR calc Af Amer: >60 mL/min (ref >60)
GFR calc non Af Amer: >60 mL/min (ref >60)
GLUCOSE: 121 mg/dL — AB (ref 65–99)
POTASSIUM: 3.1 mmol/L — AB (ref 3.5–5.1)
Sodium: 137 mmol/L (ref 135–145)
Total Bilirubin: 1.1 mg/dL (ref 0.3–1.2)
Total Protein: 7 g/dL (ref 6.5–8.1)

## 2017-04-17 LAB — URINALYSIS, ROUTINE W REFLEX MICROSCOPIC
BILIRUBIN URINE: NEGATIVE
Bacteria, UA: NONE SEEN
GLUCOSE, UA: NEGATIVE mg/dL
Ketones, ur: NEGATIVE mg/dL
NITRITE: NEGATIVE
PH: 6 (ref 5.0–8.0)
Protein, ur: NEGATIVE mg/dL
SPECIFIC GRAVITY, URINE: 1.01 (ref 1.005–1.030)

## 2017-04-17 LAB — RAPID URINE DRUG SCREEN, HOSP PERFORMED
AMPHETAMINES: NOT DETECTED
BARBITURATES: NOT DETECTED
BENZODIAZEPINES: NOT DETECTED
Cocaine: NOT DETECTED
Opiates: NOT DETECTED
TETRAHYDROCANNABINOL: NOT DETECTED

## 2017-04-17 LAB — ETHANOL

## 2017-04-17 MED ORDER — OLANZAPINE 5 MG PO TABS
7.5000 mg | ORAL_TABLET | Freq: Every day | ORAL | Status: DC
  Administered 2017-04-17: 18:00:00 7.5 mg via ORAL
  Filled 2017-04-17: qty 1

## 2017-04-17 MED ORDER — POLYVINYL ALCOHOL 1.4 % OP SOLN
1.0000 [drp] | OPHTHALMIC | Status: DC | PRN
  Filled 2017-04-17: qty 15

## 2017-04-17 MED ORDER — LISINOPRIL 5 MG PO TABS
5.0000 mg | ORAL_TABLET | Freq: Every day | ORAL | Status: DC
  Filled 2017-04-17: qty 1

## 2017-04-17 MED ORDER — LAMOTRIGINE 25 MG PO TABS
25.0000 mg | ORAL_TABLET | Freq: Every day | ORAL | Status: DC
  Administered 2017-04-17: 25 mg via ORAL
  Filled 2017-04-17: qty 1

## 2017-04-17 MED ORDER — CARBOXYMETHYLCELLULOSE SODIUM 0.25 % OP SOLN: Freq: Every day | OPHTHALMIC | Status: DC | PRN

## 2017-04-17 MED ORDER — WOMENS 50+ MULTI VITAMIN/MIN PO TABS: 1.0000 | ORAL_TABLET | Freq: Every day | ORAL | Status: DC

## 2017-04-17 MED ORDER — QUETIAPINE FUMARATE 100 MG PO TABS: 200.0000 mg | ORAL_TABLET | Freq: Every day | ORAL | Status: DC

## 2017-04-17 MED ORDER — POTASSIUM CHLORIDE CRYS ER 20 MEQ PO TBCR
20.0000 meq | EXTENDED_RELEASE_TABLET | Freq: Two times a day (BID) | ORAL | Status: DC
  Administered 2017-04-17: 20 meq via ORAL
  Filled 2017-04-17: qty 1

## 2017-04-17 MED ORDER — ADULT MULTIVITAMIN W/MINERALS CH: 1.0000 | ORAL_TABLET | Freq: Every day | ORAL | Status: DC

## 2017-04-17 MED ORDER — METOPROLOL SUCCINATE 12.5 MG HALF TABLET
12.5000 mg | ORAL_TABLET | Freq: Every day | ORAL | Status: DC
  Filled 2017-04-17: qty 1

## 2017-04-17 MED ORDER — VITAMIN C 500 MG PO TABS
500.0000 mg | ORAL_TABLET | Freq: Every day | ORAL | Status: DC
  Filled 2017-04-17: qty 1

## 2017-04-17 MED ORDER — LEVOTHYROXINE SODIUM 25 MCG PO TABS: 12.5000 ug | ORAL_TABLET | Freq: Every day | ORAL | Status: DC

## 2017-04-17 NOTE — ED Notes (Signed)
Bed: WA29 Expected date:  Expected time:  Means of arrival:  Comments: [employer] IVC 

## 2017-04-17 NOTE — Progress Notes (Signed)
77 yo female who presented to the ED under IVC by her PCP for noncompliance of her medications.  Patient, however, is not manic with no suicidal/homicidal ideations, hallucinations, or substance abuse.  She reports she has a ticket to [place] to see her daughter tomorrow.  Her daughter was called and confirmed this along with the fact that she does not have safety concerns for her mother.  She requested we assist her to get a taxi to her hotel so she can leave tomorrow for [place].  Stable for discharge, social work notified.    Nanine MeansJamison Lord, PMHNP

## 2017-04-17 NOTE — ED Notes (Signed)
Patient's daughter in [place] called and updated on discharge. [facility] and was transported to the hotel via [place]. Patient provided with discharge paperwork and states verbalizes the medications she is to take this evening. Patient VSS.
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
## 2017-04-17 NOTE — ED Notes (Signed)
Per the patient, the patient states she has a hx of bipolar and has been taking her meds as Rx'd by Dr. Redmond Schoolripp.  States they have helped her with the issue of shopping/overspending she was suffering from prior to the Rx.   Pt states she really doesn't care for living at the [facility] because all the staff has keys to her room and the staff isn't really caring and doesn't do much for the residents, that the staff has gone down a lot. States she checked into the [place] hotel to get away and have things taken care of a bit more than at home (cleaning the room, linens, etc) for a few days before [place] stay w/her daughter that was recently dx'd w/Rhumatoid arthritis.  States [place] older daughter [place] POA but would rather the younger one be POA.  She states the older daughter can't care less about the pt or the younger daughter.  Pt states that Dr. Redmond Schoolripp told the older daughter [facility](POA) that the pt's needs to be IVC.  Pt states Dr.Tripp hasn't actually examined her the last few times he has come to see her at the [facility] and a week or 2 ago he was supposed to have a psychiatrist come see her but no one has.

## 2017-04-17 NOTE — ED Triage Notes (Signed)
Pt was picked up by [employer] under IVC.  She lives at [facility].  Apparently she felt like people were stealing her things so she [facility] in [facility].  She also made arrangements to fly to [place] stay w/a daughter for a while to help her out w/her medical issues.  She has a plane ticket for tomorrow.  In the meantime, pt's other daughter in [place] called [employer] to do a welfare check of pt at the [facility] so pt checked out of there and into the [facility] which is where [employer] picked her up at.  This daughter called the pt's doctor Florentina Jenny([place] Tripp, [place] in [place] [phone]) who took out IVC papers on the pt stating that the pt hasn't been taking her meds since November, is bipolar, schizo affective, not thinking clearly, and unable to make safe decisions for herself.  Pt came w/ 2 suitcases, a box of stuff, and an "overnight-type" bag.  She speaks clearly and coherently, is calm, cooperative, denies SI/HI

## 2017-04-17 NOTE — ED Provider Notes (Signed)
[facility]-EMERGENCY DEPT Provider Note   CSN: [id number] Arrival date & time: 04/17/17  1156     History   Chief Complaint Chief Complaint  Patient presents with  . IVC    HPI Darlene Phillips is a 77 y.o. female with a history of schizoaffective disorder and Bipolar 1 Disorder who presents to the emergency department via [employer]. She is currently IVC by her PCP Dr. Florentina Jenny because the patient has been increasingly paranoid and delusional.  The patient states "I have a history of Bipolar Disorder, and I admit to buying over $3000 on [employer] over the last year" She reports that [facility] stopped mailing her the statements, and that she recently got her last statement which showed purchase those of over $300 that she did not make. She thinks the night manager at the assisted living facility where she has been living for the last few years has been stealing her belongings.  She states that over the last few weeks the night manager smashed in all of the security camera monitors at the front desk so people could not see who was coming in and out of her apartment stealing objects.  He states that her next-door neighbor always opens her front door when she does.  She states that she went to check her mail at 5 AM earlier this week and was gone for approximately 5 minutes in which she came back her apartment was trash.  She reports that she has been more fearful and has been barricading her door.  She reports that she has 2 daughters, Darlene Phillips and Darlene Phillips, who live in [place].  Darlene Phillips was recently diagnosed with rheumatoid arthritis.  The patient reports she has purchased a plane ticket to fly to [place] tomorrow to assist with Darlene Phillips's care.  She reports that she became more fearful at her assisted living facility so she packed her belongings and stated the [employer] 2 nights ago.  She states that the experience was wonderful, except she kept having to move rooms due to a foul odor that was  being admitted from the vents due to construction at the facility.  A police officer came to check on her 2 days ago after the patient's daughter called [employer] to perform a wellness check.  She states that she checked out of the hotel and went to stay at the downtown [place] because she can take the odor any longer. She was brought to the ED from the [facility] after IVC paperwork was taken out by her PCP because she has not been taking her medications since November and is unable to make safe decisions for herself.  She arrived in the ED with 2 suitcases, a box of items, and an overnight bag.  She states that she has a flight to [place] that she would like to be able to make tomorrow afternoon  She denies SI, HI, or auditory and visual hallucinations.  The history is provided by the patient. No language interpreter was used.    Past Medical History:  Diagnosis Date  . Bipolar 1 disorder [facility])     Patient Active Problem List   Diagnosis Date Noted  . Adjustment disorder with emotional disturbance 04/17/2017  . INSOMNIA 02/05/2007  . HYPERLIPIDEMIA 08/12/2006  . ANXIETY 08/12/2006  . DEPRESSION 08/12/2006  . GERD 08/12/2006  . CRAMP IN LIMB 08/12/2006  . OSTEOPENIA 08/12/2006  . SPONDYLOLISTHESIS 02/07/2004    Past Surgical History:  Procedure Laterality Date  . ABDOMINAL HYSTERECTOMY    .  APPENDECTOMY      OB History    No data available       Home Medications    Prior to Admission medications   Medication Sig Start Date End Date Taking? Authorizing Provider  Ascorbic Acid (VITAMIN C PO) Take 1 tablet by mouth daily.   Yes [provider]  CALCIUM PO Take 1 tablet by mouth daily.   Yes [provider]  Carboxymethylcellulose Sodium (THERATEARS OP) Place 1 drop into both eyes daily as needed ("TO COAT THE EYES").   Yes [provider]  Cyanocobalamin (VITAMIN B 12 PO) Take 1 tablet by mouth daily.   Yes [provider]  diphenhydrAMINE  (BENADRYL) 25 MG tablet Take 25 mg by mouth 3 (three) times daily as needed.   Yes [provider]  fluticasone (FLONASE) 50 MCG/ACT nasal spray Place 2 sprays into both nostrils daily as needed for allergies or rhinitis.   Yes [provider]  KRILL OIL PO Take 1 tablet by mouth daily.   Yes [provider]  lamoTRIgine (LAMICTAL) 25 MG tablet TAKE THREE TABLETS DAILY 03/24/17  Yes [provider]  levothyroxine (SYNTHROID, LEVOTHROID) 25 MCG tablet Take 12.5 mcg by mouth daily.  02/14/17  Yes [provider]  lisinopril (PRINIVIL,ZESTRIL) 5 MG tablet Take 5 mg by mouth daily.  03/24/17  Yes [provider]  Multiple Vitamins-Minerals (WOMENS 50+ MULTI VITAMIN/MIN) TABS Take 1 tablet by mouth daily.   Yes [provider]  OLANZapine (ZYPREXA) 15 MG tablet Take 7.5 mg by mouth daily.  02/25/17  Yes [provider]  QUEtiapine (SEROQUEL) 200 MG tablet Take 200 mg by mouth at bedtime.  02/14/17  Yes [provider]  VITAMIN E PO Take 1 tablet by mouth daily.   Yes [provider]  metoprolol succinate (TOPROL-XL) 25 MG 24 hr tablet Take 12.5 mg by mouth daily.  02/25/17   [provider]    Family History History reviewed. No pertinent family history.  Social History Social History   Tobacco Use  . Smoking status: Never Smoker  . Smokeless tobacco: Never Used  Substance Use Topics  . Alcohol use: No    Frequency: Never  . Drug use: No     Allergies   Atorvastatin and Penicillins   Review of Systems Review of Systems  Constitutional: Negative for chills and fever.  HENT: Negative for congestion.   Eyes: Negative for visual disturbance.  Respiratory: Negative for shortness of breath.   Cardiovascular: Negative for chest pain.  Gastrointestinal: Negative for abdominal pain.  Genitourinary: Negative for dysuria.  Musculoskeletal: Negative for back pain.  Skin: Negative for rash.    Allergic/Immunologic: Negative for immunocompromised state.  Neurological: Negative for weakness, numbness and headaches.  Psychiatric/Behavioral: Negative for agitation, confusion, hallucinations and suicidal ideas.   Physical Exam Updated Vital Signs BP (!) 144/75 (BP Location: Right Arm)   Pulse (!) 102   Temp 98.3 F (36.8 C) (Oral)   Resp 20   Ht 5\' 2"  (1.575 m)   Wt 68 kg (150 lb)   SpO2 97%   BMI 27.44 kg/m   Physical Exam  Constitutional: No distress.  HENT:  Head: Normocephalic.  Eyes: Conjunctivae are normal.  Neck: Neck supple.  Cardiovascular: Normal rate, regular rhythm, normal heart sounds and intact distal pulses. Exam reveals no gallop and no friction rub.  No murmur heard. Pulmonary/Chest: Effort normal. No stridor. No respiratory distress. She has no wheezes. She has no rales.  Abdominal: Soft. She exhibits no distension.  Neurological: She is alert.  Skin: Skin is warm. Capillary refill takes less than 2 seconds. No rash noted.  Psychiatric: She has a normal mood and affect. Her speech is normal and behavior is normal. She is not actively hallucinating. Thought content is paranoid. She expresses no homicidal and no suicidal ideation. She expresses no suicidal plans and no homicidal plans. She exhibits normal recent memory and normal remote memory. She is attentive.  Nursing note and vitals reviewed.    ED Treatments / Results  Labs (all labs ordered are listed, but only abnormal results are displayed) Labs Reviewed  COMPREHENSIVE METABOLIC PANEL - Abnormal; Notable for the following components:      Result Value   Potassium 3.1 (*)    Glucose, Bld 121 (*)    All other components within normal limits  URINALYSIS, ROUTINE W REFLEX MICROSCOPIC - Abnormal; Notable for the following components:   Hgb urine dipstick MODERATE (*)    Leukocytes, UA SMALL (*)    Squamous Epithelial / LPF 0-5 (*)    All other components within normal limits  ETHANOL  RAPID  URINE DRUG SCREEN, HOSP PERFORMED  CBC WITH DIFFERENTIAL/PLATELET    EKG  EKG Interpretation None       Radiology No results found.  Procedures Procedures (including critical care time)  Medications Ordered in ED Medications - No data to display   Initial Impression / Assessment and Plan / ED Course  I have reviewed the triage vital signs and the nursing notes.  Pertinent labs & imaging results that were available during my care of the patient were reviewed by me and considered in my medical decision making (see chart for details).     77 year old female with a history of bipolar disorder and schizoaffective disorder. She was IVC on arrival. No SI, HI, or auditory or visual hallucinations.  She is paranoid that her neighbor and an employee at the assisted living facility where she lives is breaking into her apartment, stealing objects, and ordering items using her credit card.  She has been barricading herself in the room at the facility. Pt medically cleared at this time. Psych hold orders placed. TTS consult with Nanine MeansJamison Lord, NP, who does not feel that the patient is manic and has no SI or HI, hallucinations, or substance abuse.  IVC rescinded. He spoke with the patient's daughter who confirmed these details and that she does not have safety concerns for her mother.  The patient's daughter requested we get her a taxi to the hotel so she can leave tomorrow for [place].  Transportation arranged by CSW.  Vital signs stable.  No acute distress.  Patient is safe for discharge at this time.   Final Clinical Impressions(s) / ED Diagnoses   Final diagnoses:  Adjustment disorder with emotional disturbance    ED Discharge Orders        Ordered    Increase activity slowly     04/17/17 1616    Diet - low sodium heart healthy     04/17/17 1616    Discharge instructions    Comments:  Discharge home   04/17/17 1616       Rasheed Welty, Coral ElseMia A, PA-C 04/18/17 [id number]    Alvira MondaySchlossman, Erin,  MD 04/19/17 0010

## 2017-04-17 NOTE — ED Notes (Signed)
Pharmacy Tech at bedside to do a medication reconciliation

## 2017-04-17 NOTE — [facility] Suicide Risk Assessment (Addendum)
Suicide Risk Assessment  Discharge Assessment   [facility]   Principal Problem: Adjustment disorder with emotional disturbance Discharge Diagnoses:  Patient Active Problem List   Diagnosis Date Noted  . Adjustment disorder with emotional disturbance [F43.29] 04/17/2017    Priority: High  . INSOMNIA [G47.00] 02/05/2007  . HYPERLIPIDEMIA [E78.5] 08/12/2006  . ANXIETY [F41.1] 08/12/2006  . DEPRESSION [F32.9] 08/12/2006  . GERD [K21.9] 08/12/2006  . CRAMP IN LIMB [R25.2] 08/12/2006  . OSTEOPENIA [M89.9, M94.9] 08/12/2006  . SPONDYLOLISTHESIS [Q76.2] 02/07/2004    Total Time spent with patient: 45 minutes  Musculoskeletal: Strength & Muscle Tone: within normal limits Gait & Station: normal Patient leans: N/A  Psychiatric Specialty Exam:   Blood pressure (!) 148/87, pulse 95, temperature 98.3 F (36.8 C), temperature source Oral, resp. rate (!) 22, height 5\' 2"  (1.575 m), weight 68 kg (150 lb), SpO2 100 %.Body mass index is 27.44 kg/m.  General Appearance: Casual  Eye Contact::  Good  Speech:  Normal Rate  Volume:  Normal  Mood:  Anxious, mild  Affect:  Congruent  Thought Process:  Coherent and Descriptions of Associations: Intact  Orientation:  Full (Time, Place, and Person)  Thought Content:  WDL and Logical  Suicidal Thoughts:  No  Homicidal Thoughts:  No  Memory:  Immediate;   Good Recent;   Good Remote;   Good  Judgement:  Fair  Insight:  Fair  Psychomotor Activity:  Normal  Concentration:  Good  Recall:  Good  Fund of Knowledge:Good  Language: Good  Akathisia:  No  Handed:  Right  AIMS (if indicated):     Assets:  Housing Leisure Time Physical Health Resilience Social Support  Sleep:     Cognition: WNL  ADL's:  Intact   Mental Status Per Nursing Assessment::   On Admission:   77 yo female who presented to the ED under IVC by her PCP for noncompliance of her medications.  Patient, however, is not manic with no  suicidal/homicidal ideations, hallucinations, or substance abuse.  She reports she has a ticket to [place] to see her daughter tomorrow.  Her daughter was called and confirmed this along with the fact that she does not have safety concerns for her mother.  She requested we assist her to get a taxi to her hotel so she can leave tomorrow for [place].  Stable for discharge, social work notified.    Demographic Factors:  Age 75 or older, Caucasian and Living alone  Loss Factors: NA  Historical Factors: NA  Risk Reduction Factors:   Sense of responsibility to family, Positive social support and Positive therapeutic relationship  Continued Clinical Symptoms:  Anxiety, mild  Cognitive Features That Contribute To Risk:  None    Suicide Risk:  Minimal: No identifiable suicidal ideation.  Patients presenting with no risk factors but with morbid ruminations; may be classified as minimal risk based on the severity of the depressive symptoms    Plan Of Care/Follow-up recommendations:  Activity:  as tolerated Diet:  heart healthy diet  LORD, JAMISON, NP 04/17/2017, 4:08 PM

## 2017-04-17 NOTE — Progress Notes (Addendum)
CSW arranged transportation with [employer] to take the pt to the [employer] [place]. Pt has credit cards to pay for the taxi. Informed pt that taxi will be $29. Pt agreed to paying that fee.   Montine CircleKelsy Glen Blatchley, Silverio LayLCSWA [facility]  [phone]

## 2017-04-18 ENCOUNTER — Encounter (HOSPITAL_COMMUNITY): Payer: Self-pay | Admitting: Emergency Medicine
